# Patient Record
Sex: Male | Born: 1945
Health system: Southern US, Community
[De-identification: ages and names within clinical notes are randomized; demographics above are authoritative.]

## PROBLEM LIST (undated history)

## (undated) DIAGNOSIS — E119 Type 2 diabetes mellitus without complications: Secondary | ICD-10-CM

## (undated) HISTORY — PX: BACK SURGERY: SHX140

---

## 1998-09-24 ENCOUNTER — Emergency Department (HOSPITAL_COMMUNITY): Admission: EM | Admit: 1998-09-24 | Discharge: 1998-09-24 | Payer: Self-pay | Admitting: Emergency Medicine

## 1998-09-24 ENCOUNTER — Encounter: Payer: Self-pay | Admitting: Emergency Medicine

## 2003-11-17 ENCOUNTER — Encounter: Admission: RE | Admit: 2003-11-17 | Discharge: 2003-11-17 | Payer: Self-pay | Admitting: Internal Medicine

## 2003-12-07 ENCOUNTER — Encounter: Admission: RE | Admit: 2003-12-07 | Discharge: 2003-12-07 | Payer: Self-pay | Admitting: Neurosurgery

## 2003-12-27 ENCOUNTER — Encounter: Admission: RE | Admit: 2003-12-27 | Discharge: 2003-12-27 | Payer: Self-pay | Admitting: Neurosurgery

## 2004-01-11 ENCOUNTER — Encounter: Admission: RE | Admit: 2004-01-11 | Discharge: 2004-01-11 | Payer: Self-pay | Admitting: Neurosurgery

## 2004-03-02 ENCOUNTER — Ambulatory Visit (HOSPITAL_COMMUNITY): Admission: RE | Admit: 2004-03-02 | Discharge: 2004-03-03 | Payer: Self-pay | Admitting: Neurosurgery

## 2004-08-16 ENCOUNTER — Encounter: Admission: RE | Admit: 2004-08-16 | Discharge: 2004-11-14 | Payer: Self-pay | Admitting: Internal Medicine

## 2004-10-30 ENCOUNTER — Ambulatory Visit (HOSPITAL_COMMUNITY): Admission: RE | Admit: 2004-10-30 | Discharge: 2004-10-31 | Payer: Self-pay | Admitting: Neurosurgery

## 2005-07-17 ENCOUNTER — Ambulatory Visit (HOSPITAL_COMMUNITY): Admission: RE | Admit: 2005-07-17 | Discharge: 2005-07-17 | Payer: Self-pay | Admitting: Orthopedic Surgery

## 2005-07-17 ENCOUNTER — Ambulatory Visit (HOSPITAL_BASED_OUTPATIENT_CLINIC_OR_DEPARTMENT_OTHER): Admission: RE | Admit: 2005-07-17 | Discharge: 2005-07-17 | Payer: Self-pay | Admitting: Orthopedic Surgery

## 2015-10-31 ENCOUNTER — Other Ambulatory Visit: Payer: Self-pay | Admitting: Internal Medicine

## 2015-10-31 ENCOUNTER — Ambulatory Visit
Admission: RE | Admit: 2015-10-31 | Discharge: 2015-10-31 | Disposition: A | Discharge status: Home / Self Care | Payer: Medicare Other | Source: Ambulatory Visit | Attending: Internal Medicine | Admitting: Internal Medicine

## 2015-10-31 DIAGNOSIS — R059 Cough, unspecified: Secondary | ICD-10-CM

## 2015-10-31 DIAGNOSIS — R05 Cough: Secondary | ICD-10-CM

## 2019-09-04 ENCOUNTER — Other Ambulatory Visit: Payer: Medicare Other

## 2019-09-07 ENCOUNTER — Other Ambulatory Visit: Payer: Medicare Other

## 2019-09-18 ENCOUNTER — Ambulatory Visit: Payer: Medicare Other

## 2019-09-23 ENCOUNTER — Ambulatory Visit: Payer: Medicare Other

## 2019-09-24 ENCOUNTER — Ambulatory Visit: Payer: Medicare Other

## 2020-03-03 ENCOUNTER — Other Ambulatory Visit: Payer: Self-pay | Admitting: Internal Medicine

## 2020-03-03 ENCOUNTER — Ambulatory Visit
Admission: RE | Admit: 2020-03-03 | Discharge: 2020-03-03 | Disposition: A | Discharge status: Home / Self Care | Payer: Medicare Other | Source: Ambulatory Visit | Attending: Internal Medicine | Admitting: Internal Medicine

## 2020-03-03 DIAGNOSIS — J40 Bronchitis, not specified as acute or chronic: Secondary | ICD-10-CM

## 2020-10-24 ENCOUNTER — Other Ambulatory Visit: Payer: Self-pay | Admitting: Internal Medicine

## 2020-10-24 DIAGNOSIS — R209 Unspecified disturbances of skin sensation: Secondary | ICD-10-CM

## 2020-11-18 ENCOUNTER — Ambulatory Visit
Admission: RE | Admit: 2020-11-18 | Discharge: 2020-11-18 | Disposition: A | Discharge status: Home / Self Care | Payer: Medicare Other | Source: Ambulatory Visit | Attending: Internal Medicine | Admitting: Internal Medicine

## 2020-11-18 DIAGNOSIS — R209 Unspecified disturbances of skin sensation: Secondary | ICD-10-CM

## 2021-05-24 ENCOUNTER — Other Ambulatory Visit: Payer: Self-pay

## 2021-05-24 ENCOUNTER — Other Ambulatory Visit (HOSPITAL_BASED_OUTPATIENT_CLINIC_OR_DEPARTMENT_OTHER): Payer: Self-pay

## 2021-05-24 ENCOUNTER — Emergency Department (HOSPITAL_BASED_OUTPATIENT_CLINIC_OR_DEPARTMENT_OTHER)
Admission: EM | Admit: 2021-05-24 | Discharge: 2021-05-24 | Disposition: A | Discharge status: Home / Self Care | Payer: Medicare Other | Attending: Emergency Medicine | Admitting: Emergency Medicine

## 2021-05-24 ENCOUNTER — Encounter (HOSPITAL_BASED_OUTPATIENT_CLINIC_OR_DEPARTMENT_OTHER): Payer: Self-pay

## 2021-05-24 DIAGNOSIS — S61215A Laceration without foreign body of left ring finger without damage to nail, initial encounter: Secondary | ICD-10-CM | POA: Insufficient documentation

## 2021-05-24 DIAGNOSIS — E119 Type 2 diabetes mellitus without complications: Secondary | ICD-10-CM | POA: Diagnosis not present

## 2021-05-24 DIAGNOSIS — Y92000 Kitchen of unspecified non-institutional (private) residence as  the place of occurrence of the external cause: Secondary | ICD-10-CM | POA: Diagnosis not present

## 2021-05-24 DIAGNOSIS — S6992XA Unspecified injury of left wrist, hand and finger(s), initial encounter: Secondary | ICD-10-CM | POA: Diagnosis present

## 2021-05-24 DIAGNOSIS — W269XXA Contact with unspecified sharp object(s), initial encounter: Secondary | ICD-10-CM | POA: Insufficient documentation

## 2021-05-24 DIAGNOSIS — Y9389 Activity, other specified: Secondary | ICD-10-CM | POA: Insufficient documentation

## 2021-05-24 HISTORY — DX: Type 2 diabetes mellitus without complications: E11.9

## 2021-05-24 MED ORDER — CEPHALEXIN 500 MG PO CAPS
500.0000 mg | ORAL_CAPSULE | Freq: Four times a day (QID) | ORAL | 0 refills | Status: AC
  Filled 2021-05-24: qty 20, 5d supply, fill #0

## 2021-05-24 MED ORDER — LIDOCAINE HCL (PF) 1 % IJ SOLN
5.0000 mL | Freq: Once | INTRAMUSCULAR | Status: DC
  Filled 2021-05-24: qty 5

## 2021-05-24 MED ORDER — TETANUS-DIPHTH-ACELL PERTUSSIS 5-2.5-18.5 LF-MCG/0.5 IM SUSY
0.5000 mL | PREFILLED_SYRINGE | Freq: Once | INTRAMUSCULAR | Status: DC
  Filled 2021-05-24: qty 0.5

## 2021-05-24 NOTE — ED Triage Notes (Signed)
He tells me that he cut his left ring finger while slicing a bagel in his kitchen.

## 2021-05-24 NOTE — ED Notes (Signed)
He was able to remove the ring he had had on his left ring finger.

## 2021-05-24 NOTE — ED Provider Notes (Signed)
[facility] EMERGENCY DEPT Provider Note   CSN: [id number] Arrival date & time: 05/24/21  0930     History Chief Complaint  Patient presents with   finger laceration    Casey Carroll is a 75 y.o. male.  HPI  75 year old male with a history of diabetes mellitus who presents to the emergency department with a fingertip laceration.  The patient states that he is right-handed.  He was slicing a bagel in his kitchen when he accidentally cut into his left ring finger.  He sustained a 1.5 cm laceration along the volar aspect of the ring finger.  No other injuries.  His tetanus is up-to-date as of 1 year ago.  [facility] GCS 15, ABC intact, bleeding controlled with pressure.  Past Medical History:  Diagnosis Date   Diabetes mellitus without complication (HCC)     There are no problems to display for this patient.   Past Surgical History:  Procedure Laterality Date   BACK SURGERY         History reviewed. No pertinent family history.  Social History   Tobacco Use   Smoking status: Never   Smokeless tobacco: Never  Substance Use Topics   Alcohol use: Yes   Drug use: Never    Home Medications Prior to Admission medications   Medication Sig Start Date End Date Taking? Authorizing Provider  cephALEXin (KEFLEX) 500 MG capsule Take 1 capsule (500 mg total) by mouth 4 (four) times daily. 05/24/21  Yes Ernie Avena, MD    Allergies    Patient has no allergy information on record.  Review of Systems   Review of Systems  Constitutional:  Negative for chills and fever.  HENT:  Negative for ear pain and sore throat.   Eyes:  Negative for pain and visual disturbance.  Respiratory:  Negative for cough and shortness of breath.   Cardiovascular:  Negative for chest pain and palpitations.  Gastrointestinal:  Negative for abdominal pain and vomiting.  Genitourinary:  Negative for dysuria and hematuria.   Musculoskeletal:  Negative for arthralgias and back pain.  Skin:  Positive for wound. Negative for color change and rash.  Neurological:  Negative for seizures and syncope.  All other systems reviewed and are negative.  Physical Exam Updated Vital Signs BP (!) 145/82 (BP Location: Right Arm)   Pulse 81   Temp (!) 97.5 F (36.4 C)   Resp 16   SpO2 98%   Physical Exam Vitals and nursing note reviewed.  Constitutional:      General: He is not in acute distress. HENT:     Head: Normocephalic and atraumatic.  Eyes:     Conjunctiva/sclera: Conjunctivae normal.     Pupils: Pupils are equal, round, and reactive to light.  Cardiovascular:     Rate and Rhythm: Normal rate and regular rhythm.  Pulmonary:     Effort: Pulmonary effort is normal. No respiratory distress.  Abdominal:     General: There is no distension.     Tenderness: There is no guarding.  Musculoskeletal:        General: No deformity or signs of injury.     Cervical back: Normal range of motion and neck supple.     Comments: 1.5cm laceration though the volar aspect of the distal 4th digit of the left hand. Neurovascularly intact  Skin:    Findings: No lesion or rash.  Neurological:     General: No focal deficit present.  Mental Status: He is alert. Mental status is at baseline.    ED Results / Procedures / Treatments   Labs (all labs ordered are listed, but only abnormal results are displayed) Labs Reviewed - No data to display  EKG None  Radiology No results found.  Procedures .Marland KitchenLaceration Repair  Date/Time: 05/24/2021 11:49 AM Performed by: Ernie Avena, MD Authorized by: Ernie Avena, MD   Consent:    Consent obtained:  Verbal   Consent given by:  Patient   Risks discussed:  Infection, pain, poor cosmetic result, poor wound healing and need for additional repair Universal protocol:    Patient identity confirmed:  Verbally with patient Anesthesia:    Anesthesia method:  Nerve block    Block location:  4th digit, L hand   Block anesthetic:  Lidocaine 1% w/o epi   Block technique:  Ring block   Block injection procedure:  Introduced needle, incremental injection and negative aspiration for blood   Block outcome:  Anesthesia achieved Laceration details:    Location:  Finger   Finger location:  L ring finger   Length (cm):  1.5   Depth (mm):  2 Pre-procedure details:    Preparation:  Patient was prepped and draped in usual sterile fashion and imaging obtained to evaluate for foreign bodies Exploration:    Hemostasis achieved with:  Tourniquet   Wound exploration: wound explored through full range of motion and entire depth of wound visualized     Wound extent: no foreign bodies/material noted, no nerve damage noted and no tendon damage noted   Treatment:    Area cleansed with:  Shur-Clens   Amount of cleaning:  Standard   Irrigation solution:  Sterile saline   Irrigation method:  Pressure wash   Debridement:  None   Undermining:  None Skin repair:    Repair method:  Sutures   Suture size:  5-0   Suture material:  Prolene   Number of sutures:  8 Approximation:    Approximation:  Close Repair type:    Repair type:  Simple Post-procedure details:    Dressing:  Non-adherent dressing   Procedure completion:  Tolerated   Medications Ordered in ED Medications - No data to display  ED Course  I have reviewed the triage vital signs and the nursing notes.  Pertinent labs & imaging results that were available during my care of the patient were reviewed by me and considered in my medical decision making (see chart for details).    MDM Rules/Calculators/A&P                           75 year old male with a history of diabetes mellitus who presents to the emergency department with a fingertip laceration.  The patient states that he is right-handed.  He was slicing a bagel in his kitchen when he accidentally cut into his left ring finger.  He sustained a 1.5 cm  laceration along the volar aspect of the ring finger.  No other injuries.  His tetanus is up-to-date as of 1 year ago.  [facility], ABC intact, bleeding controlled with pressure and a gauze bandage.  A 1.5 cm laceration was noted along the volar aspect of the distal phalanx/fourth digit on the left.  The wound was hemostatic on arrival.  A ring block was performed for anesthesia with lidocaine and a ring tourniquet was placed.  The wound was irrigated  and explored with no evidence of foreign body.  Laceration was repaired with 5-0 Prolene sutures x8.  Patient was neurovascular intact status post repair.  Patient was provided with wound care instructions.  Given his history of diabetes, he is at increased risk for poor wound healing and infection.  Will prescribe a short course of Keflex.  Advise follow-up in 7 to 10 days for suture removal and wound reassessment. Final Clinical Impression(s) / ED Diagnoses Final diagnoses:  Laceration of left ring finger without foreign body without damage to nail, initial encounter    Rx / DC Orders ED Discharge Orders          Ordered    cephALEXin (KEFLEX) 500 MG capsule  4 times daily        05/24/21 1145             Ernie Avena, MD 05/25/21 1014

## 2021-05-24 NOTE — Discharge Instructions (Addendum)
Keep your bandage on for 24 hours. Ok to apply bacitracin/neosporin ointment to the wound daily, wash daily with warm soapy water. Watch for signs of infection: redness, swelling, worsening pain, pain with passive extension of the finger, diffuse swelling.  Given your history of diabetes mellitus you are at higher risk for developing wound infection, will prescribe Keflex for 5 days.

## 2021-05-24 NOTE — ED Notes (Signed)
Sterile dressing applied
